# Patient Record
Sex: Male | Born: 2000 | Race: Black or African American | Hispanic: No | Marital: Single | Smoking: Never smoker
Health system: Southern US, Community
[De-identification: ages and names within clinical notes are randomized; demographics above are authoritative.]

## PROBLEM LIST (undated history)

## (undated) DIAGNOSIS — J45909 Unspecified asthma, uncomplicated: Secondary | ICD-10-CM

---
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
## 2021-09-17 ENCOUNTER — Emergency Department (HOSPITAL_COMMUNITY)
Admission: EM | Admit: 2021-09-17 | Discharge: 2021-09-17 | Disposition: A | Discharge status: Home / Self Care | Payer: Medicaid Other | Attending: Emergency Medicine | Admitting: Emergency Medicine

## 2021-09-17 ENCOUNTER — Encounter (HOSPITAL_COMMUNITY): Payer: Self-pay | Admitting: Emergency Medicine

## 2021-09-17 DIAGNOSIS — Y92410 Unspecified street and highway as the place of occurrence of the external cause: Secondary | ICD-10-CM | POA: Insufficient documentation

## 2021-09-17 DIAGNOSIS — M545 Low back pain, unspecified: Secondary | ICD-10-CM | POA: Diagnosis present

## 2021-09-17 MED ORDER — KETOROLAC TROMETHAMINE 30 MG/ML IJ SOLN
30.0000 mg | Freq: Once | INTRAMUSCULAR | Status: AC
  Administered 2021-09-17: 30 mg via INTRAMUSCULAR
  Filled 2021-09-17: qty 1
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
## 2021-09-17 NOTE — Discharge Instructions (Addendum)
Back Pain:  Back pain is very common.  The pain often gets better over time.  The cause of back pain is usually not dangerous.  Most people can learn to manage their back pain on their own.  However if you develop severe or worsening pain, low back pain with fever, numbness, weakness or inability to walk or urinate/stool, you should return to the ER immediately.  Please follow up with your doctor this week for a recheck if still having symptoms.  Low back pain is discomfort in the lower back that may be due to injuries to muscles and ligaments around the spine.  Occasionally, it may be caused by a a problem to a part of the spine called a disc. The pain may last several days or a week;  However, most patients get completely well in 4 weeks.  Medications are also useful to help with pain control.  A commonly prescribed medications includes acetaminophen.  This medication is generally safe, though you should not take more than 8 of the extra strength (500mg ) pills a day.  Non steroidal anti inflammatory medications including Naproxen;  These medications help both pain and swelling and are very useful in treating back pain.  They should be taken with food, as they can cause stomach upset, and more seriously, stomach bleeding.    Be aware that if you develop new symptoms, such as a fever, leg weakness, difficulty with or loss of control of your urine or bowels, abdominal pain, or more severe pain, you will need to seek medical attention and  / or return to the Emergency department.    Home Care Stay active.  Start with short walks on flat ground if you can.  Try to walk farther each day. Do not sit, drive or stand in one place for more than 30 minutes.  Do not stay in bed. Do not avoid exercise or work.  Activity can help your back heal faster. Be careful when you bend or lift an object.  Bend at your knees, keep the object close to you, and do not twist. Sleep on a firm mattress.  Lie on your side,  and bend your knees.  If you lie on your back, put a pillow under your knees. Only take medicines as told by your doctor. Put ice on the injured area. Put ice in a plastic bag Place a towel between your skin and the bag Leave the ice on for 15-20 minutes, 3-4 times a day for the first 2-3 days. 210 After that, you can switch between ice and heat packs. Ask your doctor about back exercises or massage. Avoid feeling anxious or stressed.  Find good ways to deal with stress, such as exercise.  Get Help Right Way If: Your pain does not go away with rest or medicine. Your pain does not go away in 1 week. You have new problems. You do not feel well. The pain spreads into your legs. You cannot control when you poop (bowel movement) or pee (urinate) You feel sick to your stomach (nauseous) or throw up (vomit) You have belly (abdominal) pain. You feel like you may pass out (faint). If you develop a fever.  Make Sure you: Understand these instructions. Watch your condition Get help right away if you are not doing well or get worse.  Managing Pain  Pain after surgery or related to activity is often due to strain/injury to muscle, tendon, nerves and/or incisions.  This pain is usually short-term and will  improve in a few months.   Many people find it helpful to do the following things TOGETHER to help speed the process of healing and to get back to regular activity more quickly:  Avoid heavy physical activity  no lifting greater than 20 pounds Do not "push through" the pain.  Listen to your body and avoid positions and maneuvers than reproduce the pain Walking is okay as tolerated, but go slowly and stop when getting sore.  Remember: If it hurts to do it, then don't do it! Take Anti-inflammatory medication  Take with food/snack around the clock for 1-2 weeks This helps the muscle and nerve tissues become less irritable and calm down faster Choose ONE of the following over-the-counter  medications: Naproxen 220mg  tabs (ex. Aleve) 1-2 pills twice a day  Ibuprofen 200mg  tabs (ex. Advil, Motrin) 3-4 pills with every meal and just before bedtime Acetaminophen 500mg  tabs (Tylenol) 1-2 pills with every meal and just before bedtime Use a Heating pad or Ice/Cold Pack 4-6 times a day May use warm bath/hottub  or showers Try Gentle Massage and/or Stretching  at the area of pain many times a day stop if you feel pain - do not overdo it  Try these steps together to help you body heal faster and avoid making things get worse.  Doing just one of these things may not be enough.    If you are not getting better after two weeks or are noticing you are getting worse, contact your PCP or return here for further advice; we may need to re-evaluate you & see what other things we can do to help.

## 2023-07-16 ENCOUNTER — Emergency Department (HOSPITAL_COMMUNITY): Payer: Self-pay

## 2023-07-16 ENCOUNTER — Emergency Department (HOSPITAL_COMMUNITY)
Admission: EM | Admit: 2023-07-16 | Discharge: 2023-07-16 | Disposition: A | Discharge status: Home / Self Care | Payer: Self-pay | Attending: Emergency Medicine | Admitting: Emergency Medicine

## 2023-07-16 ENCOUNTER — Other Ambulatory Visit: Payer: Self-pay

## 2023-07-16 ENCOUNTER — Encounter (HOSPITAL_COMMUNITY): Payer: Self-pay | Admitting: *Deleted

## 2023-07-16 DIAGNOSIS — S29019A Strain of muscle and tendon of unspecified wall of thorax, initial encounter: Secondary | ICD-10-CM | POA: Insufficient documentation

## 2023-07-16 DIAGNOSIS — J45909 Unspecified asthma, uncomplicated: Secondary | ICD-10-CM | POA: Insufficient documentation

## 2023-07-16 DIAGNOSIS — S39012A Strain of muscle, fascia and tendon of lower back, initial encounter: Secondary | ICD-10-CM | POA: Diagnosis not present

## 2023-07-16 DIAGNOSIS — Y92410 Unspecified street and highway as the place of occurrence of the external cause: Secondary | ICD-10-CM | POA: Diagnosis not present

## 2023-07-16 DIAGNOSIS — M546 Pain in thoracic spine: Secondary | ICD-10-CM | POA: Diagnosis present

## 2023-07-16 MED ORDER — IBUPROFEN 400 MG PO TABS
600.0000 mg | ORAL_TABLET | Freq: Once | ORAL | Status: AC
  Administered 2023-07-16: 600 mg via ORAL
  Filled 2023-07-16: qty 1

## 2023-07-16 MED ORDER — CYCLOBENZAPRINE HCL 10 MG PO TABS: 10.0000 mg | ORAL_TABLET | Freq: Three times a day (TID) | ORAL | 0 refills | Status: DC | PRN

## 2023-07-16 NOTE — Discharge Instructions (Addendum)
 Apply ice for 30 minutes at a time, 4 times a day.  Take acetaminophen  and/or ibuprofen as needed for pain.  Please be aware that if you combine ibuprofen and acetaminophen , you will get better pain relief and you get from taking either medication by itself.
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
## 2023-07-21 ENCOUNTER — Encounter (HOSPITAL_COMMUNITY): Payer: Self-pay | Admitting: *Deleted

## 2023-07-21 ENCOUNTER — Ambulatory Visit (HOSPITAL_COMMUNITY)
Admission: EM | Admit: 2023-07-21 | Discharge: 2023-07-21 | Disposition: A | Discharge status: Home / Self Care | Payer: Self-pay | Attending: Internal Medicine | Admitting: Internal Medicine

## 2023-07-21 ENCOUNTER — Other Ambulatory Visit: Payer: Self-pay

## 2023-07-21 DIAGNOSIS — S29019A Strain of muscle and tendon of unspecified wall of thorax, initial encounter: Secondary | ICD-10-CM

## 2023-07-21 DIAGNOSIS — S39012A Strain of muscle, fascia and tendon of lower back, initial encounter: Secondary | ICD-10-CM

## 2023-07-21 DIAGNOSIS — S161XXA Strain of muscle, fascia and tendon at neck level, initial encounter: Secondary | ICD-10-CM

## 2023-07-21 MED ORDER — IBUPROFEN 800 MG PO TABS: 800.0000 mg | ORAL_TABLET | Freq: Three times a day (TID) | ORAL | 0 refills | Status: AC

## 2023-07-21 MED ORDER — CYCLOBENZAPRINE HCL 10 MG PO TABS: 10.0000 mg | ORAL_TABLET | Freq: Three times a day (TID) | ORAL | 0 refills | Status: AC | PRN
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
## 2023-07-21 NOTE — Discharge Instructions (Signed)
 Apply ice and heat on areas of pain for 15 minutes each 3 times a day for 7 days Do not take the Muscle relaxer if you have to drive since it can cause sleepiness. Since you do not have a primary care doctor, find a physical therapy group, or and chiropractic group you can go to that take car insurance payments.
